# Patient Record
Sex: Female | Born: 1990 | Race: White | Hispanic: No | Marital: Married
Health system: Midwestern US, Community
[De-identification: ages and names within clinical notes are randomized; demographics above are authoritative.]

## PROBLEM LIST (undated history)

## (undated) DIAGNOSIS — R109 Unspecified abdominal pain: Secondary | ICD-10-CM

---

## 2021-05-17 LAB — ANTITHROMBIN 3 ACTIVITY: AT-III Activity: 88 % (ref 85.0–132.0)

## 2021-05-18 LAB — FACTOR 5 ACTIVITY: Factor V Activity: 102 % (ref 70–150)

## 2021-05-19 LAB — PROTEIN C ACTIVITY: Protein C-Functional: 99 % (ref 73–180)

## 2021-05-19 LAB — PROTEIN S ACTIVITY: Protein S Activity: 89 % (ref 63–140)

## 2021-05-23 LAB — PROTHROMBIN GENE MUTATION

## 2022-09-07 ENCOUNTER — Inpatient Hospital Stay
Admit: 2022-09-07 | Discharge: 2022-09-07 | Disposition: A | Discharge status: Home / Self Care | Payer: PRIVATE HEALTH INSURANCE | Attending: Emergency Medicine

## 2022-09-07 ENCOUNTER — Emergency Department: Admit: 2022-09-07 | Payer: PRIVATE HEALTH INSURANCE

## 2022-09-07 DIAGNOSIS — R109 Unspecified abdominal pain: Secondary | ICD-10-CM

## 2022-09-07 DIAGNOSIS — R102 Pelvic and perineal pain: Secondary | ICD-10-CM

## 2022-09-07 LAB — URINALYSIS W/ RFLX MICROSCOPIC
Bilirubin Urine: NEGATIVE
Blood, Urine: NEGATIVE
Glucose, UA: NEGATIVE
Ketones, Urine: >=80 mg/dL — AB
Leukocyte Esterase, Urine: NEGATIVE
Nitrite, Urine: NEGATIVE
Protein, UA: NEGATIVE
Specific Gravity, UA: 1.025 (ref 1.003–1.035)
Urobilinogen, Urine: 0.2 EU/dL
pH, UA: 6 (ref 4.5–8.0)

## 2022-09-07 LAB — POC PREGNANCY UR-QUAL: Preg Test, Ur: NEGATIVE

## 2022-09-07 MED ORDER — OMEPRAZOLE 20 MG PO CPDR
20 | ORAL_CAPSULE | Freq: Every day | ORAL | 0 refills | 30.00000 days | Status: DC
Start: 2022-09-07 — End: 2024-12-14

## 2022-09-07 MED ORDER — LIDOCAINE VISCOUS HCL 2 % MT SOLN
2 % | Freq: Once | OROMUCOSAL | Status: AC
Start: 2022-09-07 — End: 2022-09-07
  Administered 2022-09-07: 20:00:00 via ORAL

## 2022-09-07 MED ORDER — CLARITHROMYCIN 500 MG PO TABS
500 MG | ORAL_TABLET | Freq: Two times a day (BID) | ORAL | 0 refills | Status: AC
Start: 2022-09-07 — End: 2022-09-17

## 2022-09-07 MED ORDER — DICYCLOMINE HCL 10 MG PO CAPS
10 MG | Freq: Once | ORAL | Status: AC
Start: 2022-09-07 — End: 2022-09-07
  Administered 2022-09-07: 20:00:00 10 mg via ORAL

## 2022-09-07 MED ORDER — METRONIDAZOLE 500 MG PO TABS
500 MG | ORAL_TABLET | Freq: Two times a day (BID) | ORAL | 0 refills | Status: AC
Start: 2022-09-07 — End: 2022-09-17

## 2022-09-07 MED ORDER — KETOROLAC TROMETHAMINE 30 MG/ML IJ SOLN
30 MG/ML | Freq: Once | INTRAMUSCULAR | Status: AC
Start: 2022-09-07 — End: 2022-09-07
  Administered 2022-09-07: 22:00:00 30 mg via INTRAMUSCULAR

## 2022-09-07 MED ORDER — ALUM & MAG HYDROXIDE-SIMETH 200-200-20 MG/5ML PO SUSP
200-200-20 MG/5ML | ORAL | Status: DC
Start: 2022-09-07 — End: 2022-09-07

## 2022-09-07 MED FILL — KETOROLAC TROMETHAMINE 30 MG/ML IJ SOLN: 30 MG/ML | INTRAMUSCULAR | Qty: 1

## 2022-09-07 MED FILL — MAG-AL PLUS 200-200-20 MG/5ML PO LIQD: 200-200-20 MG/5ML | ORAL | Qty: 30

## 2022-09-07 MED FILL — DICYCLOMINE HCL 10 MG PO CAPS: 10 MG | ORAL | Qty: 1

## 2022-09-07 NOTE — ED Provider Notes (Signed)
[facility] EMERGENCY DEPT  EMERGENCY DEPARTMENT ENCOUNTER      Pt Name: Tina Prince  MRN: [id number]  Birthdate 07-02-1991  Date of evaluation: 09/07/2022  Provider: Burt Knack, MD    CHIEF COMPLAINT       Chief Complaint   Patient presents with    Flank Pain    Abdominal Pain     Pt states she has had abdominal and bilateral flank pain for 3.5 weeks, she had a kidney stone a month ago, on Monday she had a ct with contrast and was told it was normal. Denies N/V/D. Has loss of appetite. Is on eliquis for a dvt, did not take her dose today. States she had her appendix out 2013.  A&Ox4    Dysuria     States she has irritation while urinating for 2 weeks, her urologist gave her flomax to help after the stone.          HISTORY OF PRESENT ILLNESS    Tina Prince is a 31 y.o. female     31 yo F with h/o PE and DVT unknown cause presents with > 1 month of bilateral flank and bilateral lower pelvic pain going to back and intermittent epigastric pain. Has been working in [place] so initially went to internist there. Thought maybe kidney stone on xray so sent to urology. Urologist thought phlebolith. Did a CT which did not show kidney stones but showed dilated ureters so started Flomax but didn't improved symptoms. Pain worsened so went to ED on Monday ( 5 days ago) and had CT with contrast and labs done with no cause for pain. Given lortab but pain still hurting daily and getting worse. Came back home to [place] today and presents for pain and attempt to find cause. Took PPI for the first time this morning.     The history is provided by the patient.       Nursing Notes were reviewed.    REVIEW OF SYSTEMS       Review of Systems   Constitutional:  Negative for activity change and fever.   HENT:  Negative for congestion and voice change.    Eyes:  Negative for visual disturbance.   Respiratory:  Negative for cough, chest tightness and shortness of breath.    Cardiovascular:  Negative for chest pain and leg swelling.    Gastrointestinal:  Positive for abdominal pain. Negative for nausea and vomiting.   Genitourinary:  Positive for pelvic pain. Negative for dysuria.   Musculoskeletal:  Positive for back pain. Negative for neck pain and neck stiffness.   Skin:  Negative for rash.   Neurological:  Negative for dizziness and headaches.   Psychiatric/Behavioral:  Negative for confusion.    All other systems reviewed and are negative.      Except as noted above the remainder of the review of systems was reviewed and negative.       PAST MEDICAL HISTORY   No past medical history on file.      SURGICAL HISTORY     No past surgical history on file.      CURRENT MEDICATIONS       Discharge Medication List as of 09/07/2022  6:12 PM          ALLERGIES     Minocycline    FAMILY HISTORY     No family history on file.       SOCIAL HISTORY       Social History  Socioeconomic History    Marital status: Married       SCREENINGS         Glasgow Coma Scale  Eye Opening: Spontaneous  Best Verbal Response: Oriented  Best Motor Response: Obeys commands  Glasgow Coma Scale Score: 15                     CIWA Assessment  BP: (!) 144/99  Pulse: (!) 107                 PHYSICAL EXAM       ED Triage Vitals [09/07/22 1437]   BP Temp Temp Source Pulse Respirations SpO2 Height Weight - Scale   (!) 144/99 98.1 F (36.7 C) Oral (!) 107 19 100 % 5\' 5"  (1.651 m) 140 lb (63.5 kg)       Physical Exam  Vitals and nursing note reviewed.   Constitutional:       General: She is not in acute distress.     Appearance: She is not ill-appearing.   HENT:      Head: Normocephalic and atraumatic.   Eyes:      Extraocular Movements: Extraocular movements intact.   Cardiovascular:      Rate and Rhythm: Normal rate and regular rhythm.      Pulses: Normal pulses.      Heart sounds: Normal heart sounds. No murmur heard.  Pulmonary:      Effort: Pulmonary effort is normal.      Breath sounds: Normal breath sounds.   Abdominal:      General: There is no distension.      Palpations:  Abdomen is soft.      Tenderness: There is no abdominal tenderness. There is no right CVA tenderness, left CVA tenderness, guarding or rebound.   Musculoskeletal:         General: Normal range of motion.      Cervical back: Normal range of motion.   Skin:     General: Skin is warm and dry.      Capillary Refill: Capillary refill takes less than 2 seconds.      Findings: No rash.   Neurological:      General: No focal deficit present.      Mental Status: She is alert and oriented to person, place, and time.   Psychiatric:         Behavior: Behavior normal.         DIAGNOSTIC RESULTS     EKG: All EKG's are interpreted by the Emergency Department Physician who either signs or Co-signs this chart in the absence of a cardiologist.        RADIOLOGY:   Non-plain film images such as CT, Ultrasound and MRI are read by the radiologist. Plain radiographic images are visualized and preliminarily interpreted by the emergency physician with the below findings:      Interpretation per the Radiologist below, if available at the time of this note:    [place] NON OB TRANSVAGINAL   Final Result   Normal uterus and bilateral ovaries.            ED BEDSIDE ULTRASOUND:   Performed by ED Physician - none    LABS:  Labs Reviewed   URINALYSIS W/ RFLX MICROSCOPIC - Abnormal; Notable for the following components:       Result Value    Ketones, Urine >=80 (*)     All other components within normal limits   POC PREGNANCY  UR-QUAL   POC PREGNANCY UR-QUAL       All other labs were within normal range or not returned as of this dictation.    EMERGENCY DEPARTMENT COURSE and DIFFERENTIAL DIAGNOSIS/MDM:   Vitals:    Vitals:    09/07/22 1437   BP: (!) 144/99   Pulse: (!) 107   Resp: 19   Temp: 98.1 F (36.7 C)   TempSrc: Oral   SpO2: 100%   Weight: 63.5 kg   Height: 5\' 5"  (1.651 m)         Medical Decision Making  Ddx includes but not limited to: PUD, endometriosis, ovarian torsion, Diverticulitis, Celiac      31 yo F with h/o PE and DVT unknown cause  presents with > 1 month of bilateral flank and bilateral lower pelvic pain going to back and intermittent epigastric pain. S/p 2 xrays, 2 Cts, and labs with no cause found and pain worsening.   GI cocktail and bentyl given. Some improvement.   26 transvaginal pelvic negative.   Will give one dose of IM toradol.  Given GI follow up. Has PCP follow up Monday and Gyn on Tuesday.         Amount and/or Complexity of Data Reviewed  Labs: ordered.  Radiology: ordered.    Risk  Prescription drug management.            REASSESSMENT          CONSULTS:  None    PROCEDURES:  Unless otherwise noted below, none     Procedures        FINAL IMPRESSION      1. Abdominal pain, unspecified abdominal location    2. Pelvic pain          DISPOSITION/PLAN   DISPOSITION Decision To Discharge 09/07/2022 05:59:10 PM      PATIENT REFERRED TO:  [facility]  [address] [place]  [phone]  Schedule an appointment as soon as possible for a visit         DISCHARGE MEDICATIONS:  Discharge Medication List as of 09/07/2022  6:12 PM        START taking these medications    Details   clarithromycin (BIAXIN) 500 MG tablet Take 1 tablet by mouth 2 times daily for 10 days, Disp-20 tablet, R-0Print      metroNIDAZOLE (FLAGYL) 500 MG tablet Take 1 tablet by mouth 2 times daily for 10 days, Disp-20 tablet, R-0Print      omeprazole (PRILOSEC) 20 MG delayed release capsule Take 1 capsule by mouth every morning (before breakfast), Disp-30 capsule, R-0Print           Controlled Substances Monitoring:          No data to display                (Please note that portions of this note were completed with a voice recognition program.  Efforts were made to edit the dictations but occasionally words are mis-transcribed.)    09/09/2022, MD (electronically signed)  Attending Emergency Physician            Burt Knack, MD  09/07/22 [phone]

## 2022-09-16 ENCOUNTER — Inpatient Hospital Stay: Admit: 2022-09-16 | Discharge: 2022-09-16 | Payer: PRIVATE HEALTH INSURANCE | Primary: Family Medicine

## 2022-09-16 ENCOUNTER — Encounter

## 2022-09-16 DIAGNOSIS — R109 Unspecified abdominal pain: Secondary | ICD-10-CM

## 2022-09-16 LAB — IGA: IgA: 289 mg/dL (ref 70.0–400.0)

## 2022-09-17 LAB — TISSUE TRANSGLUTAMINASE, IGG: Tissue Transglutaminase (tTG) Antibody (IgG): <2 unit/mL (ref 0–5)

## 2022-09-17 LAB — TISSUE TRANSGLUTAMINASE, IGA: Tissue Transglutaminase IgA: <2 unit/mL (ref 0–3)

## 2022-10-29 IMAGING — CT CT HEAD W/O CM
4 series · 16 of 47 positions shown, 18 images · non-contrast
Comparison: None

CLINICAL DATA: Dizziness, nonspecific.  Headache.  Anticoagulated.



[Series 2: head wo · axial · 0.40mm/px · z∈[+1049,+1159]mm · 7 of 30 slices shown, 9 images]
[im 4/30  brain]
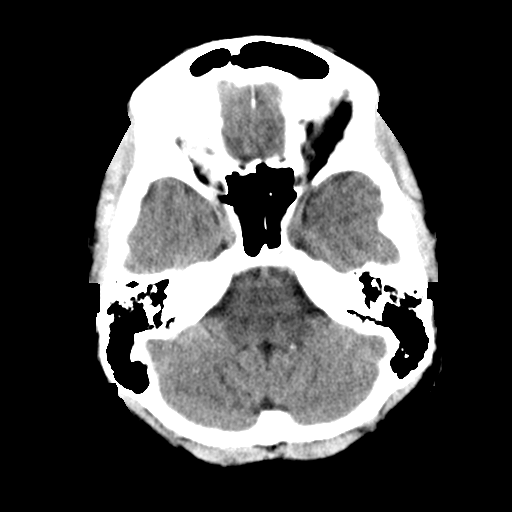
[im 4/30  bone]
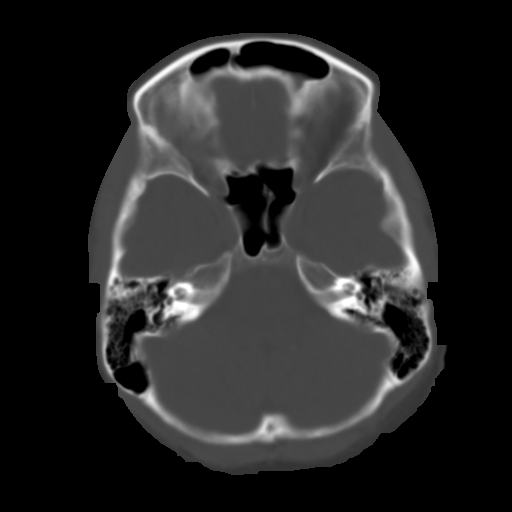
[im 8/30  brain]
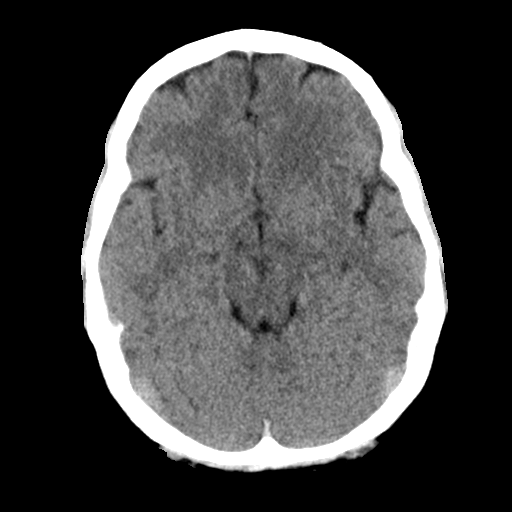
[im 11/30  brain]
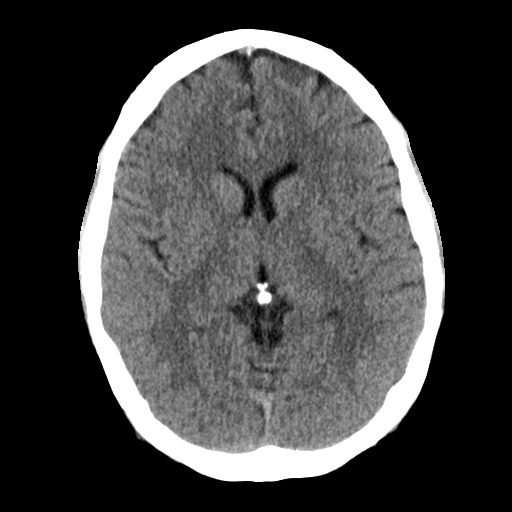
[im 15/30  brain]
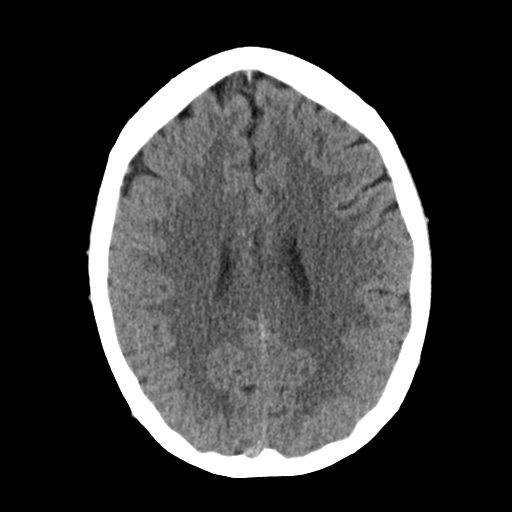
[im 19/30  brain]
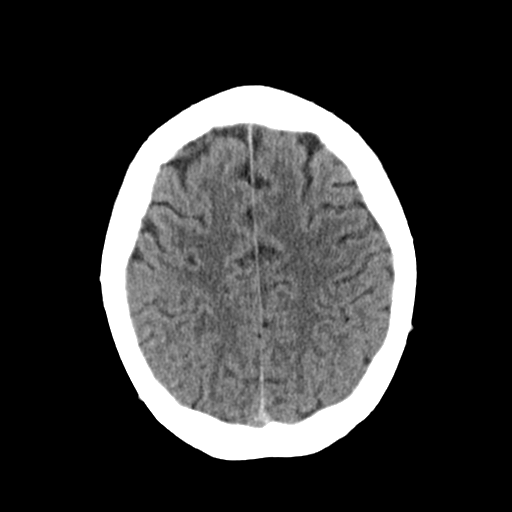
[im 19/30  bone]
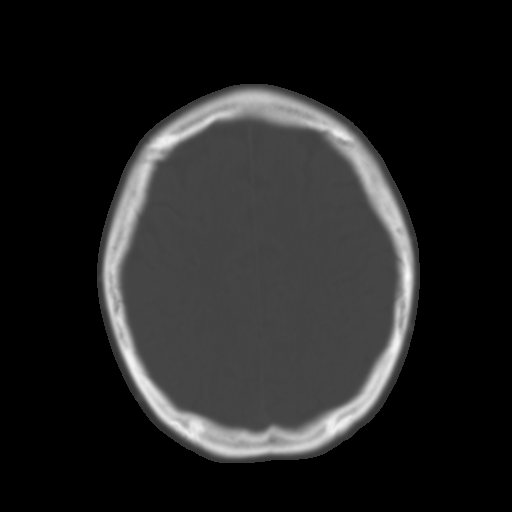
[im 22/30  brain]
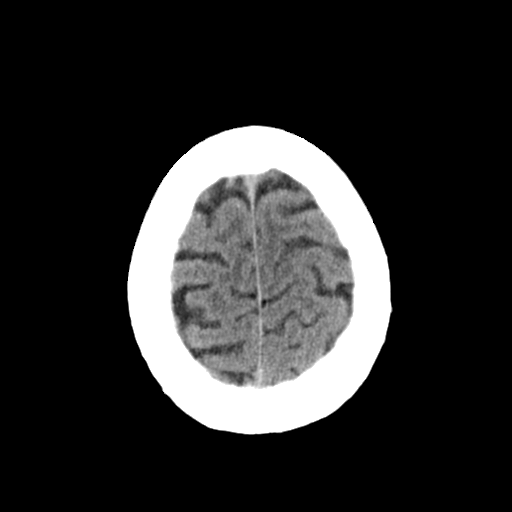
[im 26/30  brain]
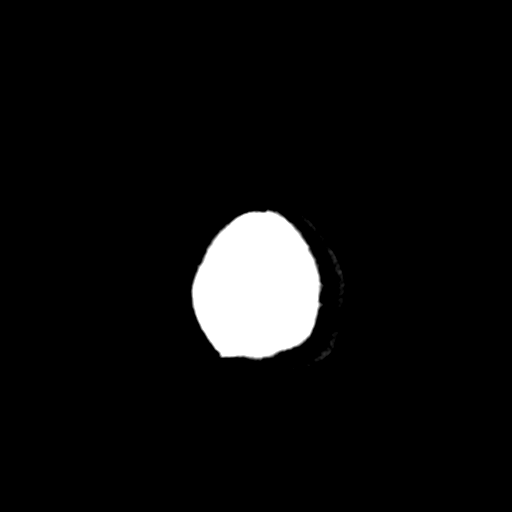

[Series 3: head bone · axial · 0.40mm/px · z∈[+1048,+1076]mm · 3 of 74 slices shown]
[im 8/74  bone]
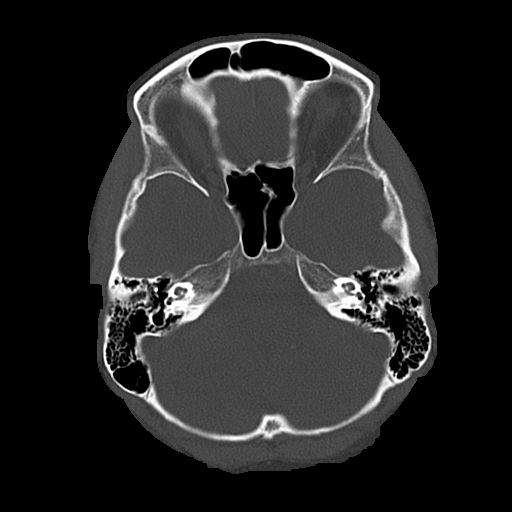
[im 15/74  bone]
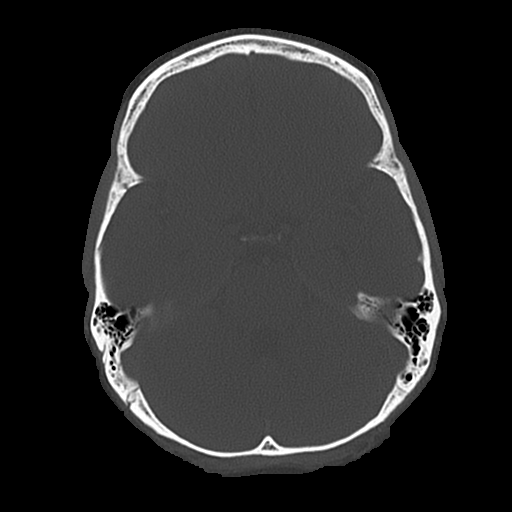
[im 22/74  bone]
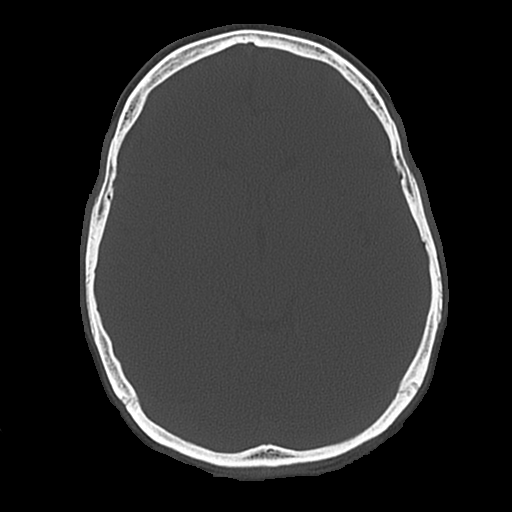

[Series 4: coronal soft · coronal · 0.30mm/px · 3 of 64 slices shown]
[im 22/64  brain]
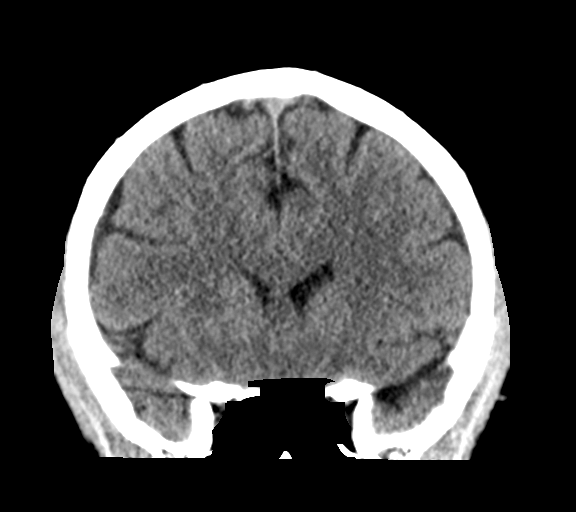
[im 29/64  brain]
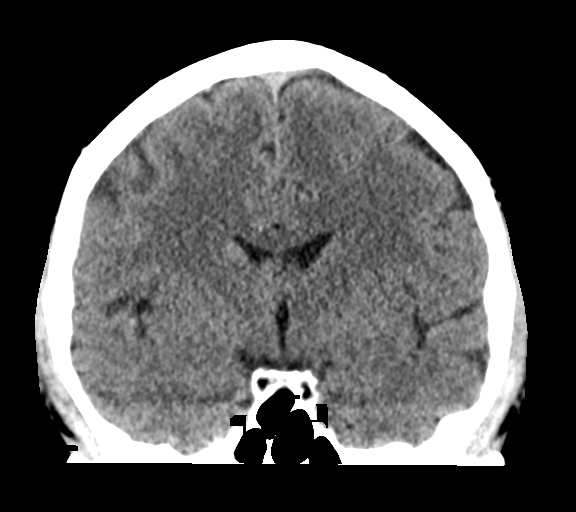
[im 36/64  brain]
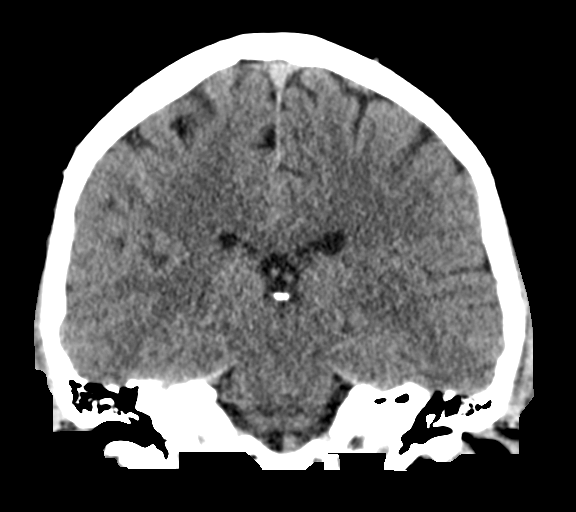

[Series 5: sagittal soft · sagittal · 0.30mm/px · 3 of 58 slices shown]
[im 20/58  brain]
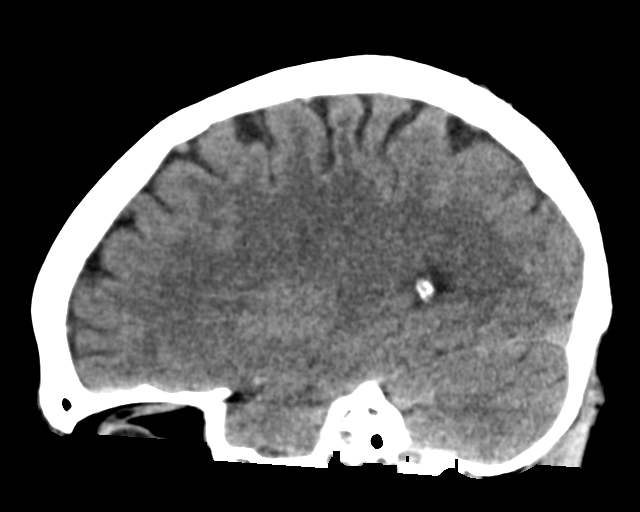
[im 29/58  brain]
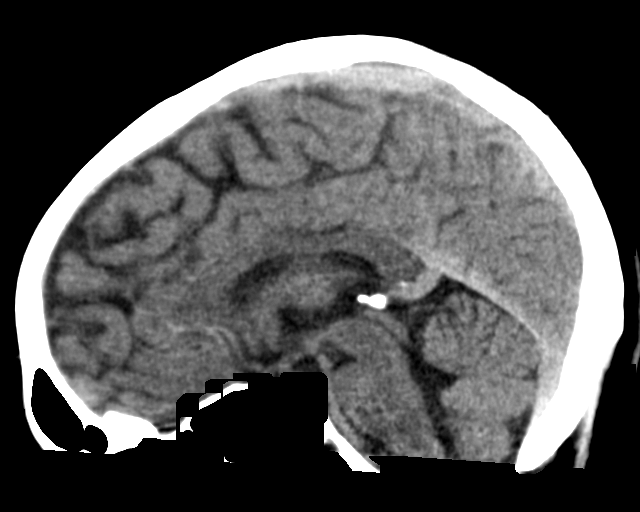
[im 39/58  brain]
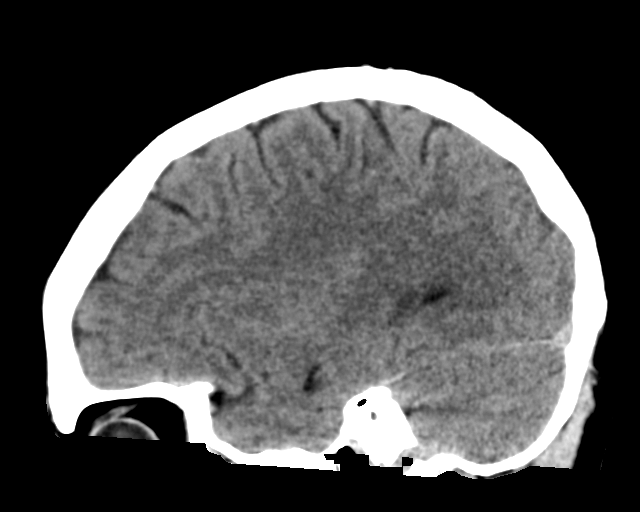

[16 of 47 positions shown; findings below may reference images not displayed]

FINDINGS: Brain: The brain shows a normal appearance without evidence of
malformation, atrophy, old or acute small or large vessel
infarction, mass lesion, hemorrhage, hydrocephalus or extra-axial
collection.

Vascular: No hyperdense vessel. No evidence of atherosclerotic
calcification.

Skull: Normal.  No traumatic finding.  No focal bone lesion.

Sinuses/Orbits: Sinuses are clear. Orbits appear normal. Mastoids
are clear.

Other: None significant
IMPRESSION: Normal head CT

## 2022-11-14 ENCOUNTER — Encounter: Payer: PRIVATE HEALTH INSURANCE | Attending: Women's Health | Primary: Family Medicine

## 2024-12-14 ENCOUNTER — Ambulatory Visit
Admit: 2024-12-14 | Discharge: 2024-12-14 | Payer: BLUE CROSS/BLUE SHIELD | Attending: Student in an Organized Health Care Education/Training Program | Primary: Family Medicine

## 2024-12-14 MED ORDER — BORIC ACID 600 MG VA SUPP
600 | Freq: Every evening | VAGINAL | 1 refills | Status: AC
Start: 2024-12-14 — End: ?

## 2024-12-14 NOTE — Progress Notes (Signed)
 DATE OF VISIT:  12/14/24    PATIENT NAME:  Tina Prince     DATE OF BIRTH: [birth year]    REASON FOR VISIT:    Chief Complaint   Patient presents with    New Patient    Annual Exam     Annual 2024, PAP 2023 wnl per pt.         HISTORY OF PRESENT ILLNESS:  Tina Prince is a 34 y.o. G0P0000 presenting for recurrent yeast infections. Says she was recently prescribed diflucan and had a cx that grew back candida glabrata. Says she had ongoing symptoms and was then prescribed clotrimazole inserts for 14 days. Says main symptoms have been itching and small amount of discharge. Also reports dx of IC that flared at same time as infection.     Patient's last menstrual period was 12/03/2024.  Allergies   Allergen Reactions    Minocycline Anaphylaxis     Current Outpatient Medications   Medication Sig Dispense Refill    fluconazole (DIFLUCAN) 150 MG tablet TAKE 1 TABLET BY MOUTH EVERY 72 HOURS      Boric Acid 600 MG SUPP Place 30 each vaginally at bedtime 21 suppository 1    valACYclovir (VALTREX) 1 g tablet TAKE 2 TABLETS BY MOUTH TWICE DAILY FOR 1 DAY AS NEEDED      omeprazole  (PRILOSEC) 20 MG delayed release capsule Take 1 capsule by mouth every morning (before breakfast) 30 capsule 0     No current facility-administered medications for this visit.     Past Medical History:   Diagnosis Date    Abnormal Pap smear of cervix     Deep vein thrombosis (HCC)      Past Surgical History:   Procedure Laterality Date    COLPOSCOPY       Social History     Socioeconomic History    Marital status: Married   Tobacco Use    Smoking status: Never    Smokeless tobacco: Never   Substance and Sexual Activity    Alcohol use: Not Currently    Drug use: Never    Sexual activity: Yes     Partners: Male     Comment: some pain and dryness     Social Drivers of Health      Received from [facility]    Received from [facility]    [facility]     Family History   Problem Relation Age of Onset    Endometrial Cancer Paternal Grandmother      Diabetes Paternal Grandmother     Stroke Maternal Grandfather     Hypertension Father     Heart Surgery Father     Hypertension Mother     Migraines Mother         REVIEW OF SYSTEMS:  Negative other than HPI      PHYSICAL EXAM:  BP 112/80 (BP Site: Right Upper Arm, Patient Position: Sitting, BP Cuff Size: Medium Adult)   Ht 1.651 m (5' 5)   Wt 61.2 kg (135 lb)   LMP 12/03/2024   BMI 22.47 kg/m   Body mass index is 22.47 kg/m.  General:  well developed, well nourished, in no acute distress     Head:   normocephalic and atraumatic     Lungs:  normal work of breathing on room air     Heart:   warm and well perfused     ABDOMEN: soft, nontender, nondistended, no masses palpable.  Gynecological:  EXTERNAL GENITALIA: introitus normal, no lesions.   URETHRA: normal.   VAGINA: pink mucosa without any lesions, thick white discharge  CERVIX: normal appearing cervix  UTERUS: normal mobility, nontender, normal size.   ADNEXA: no mass, nontender.      Extremities: no clubbing, cyanosis, edema, or deformity noted with normal full range of motion of all joints     Neurologic:  no focal deficits,normal coordination, muscle strength and tone     Skin:   intact without lesions or rashes     Psych:  alert and cooperative; normal mood and affect; normal attention span and concentration      ASSESSMENT/PLAN:    Tina Prince was seen today for new patient and annual exam.    Diagnoses and all orders for this visit:    Yeast infection  -     Vaginitis Bacteria/Yeast Candida/Trich/GC/CT; Future    Other orders  -     Boric Acid 600 MG SUPP; Place 30 each vaginally at bedtime     See above. Will repeat yeast cx today to see if infection still persistent. Discussed that candida glabrata is often resistant to azoles which is why I suspect she is still symptomatic. Discussed in these situations, recommend treatment with boric acid inserts nightly for 2-3 weeks. Will follow up in one month. Will plan for wwe that visit.     Return in about 1  month (around 01/14/2025) for wwe 1-2.       Tina FORBES Hummer, MD

## 2024-12-15 NOTE — Telephone Encounter (Signed)
 Pt cb and notified of msg, verbalized understanding

## 2024-12-17 LAB — SPECIMEN STATUS REPORT

## 2024-12-19 LAB — NUSWAB VAGINITIS PLUS (VG+)
Candida albicans, NAA: NEGATIVE
Candida glabrata: POSITIVE — AB
Chlamydia trachomatis, NAA: NEGATIVE
Neisseria Gonorrhoeae, NAA: NEGATIVE
Trichomonas Vaginalis by NAA: NEGATIVE
